# Patient Record
Sex: Female | Born: 1950 | Race: Black or African American | Hispanic: No | Smoking: Current some day smoker
Health system: Southern US, Community
[De-identification: ages and names within clinical notes are randomized; demographics above are authoritative.]

## PROBLEM LIST (undated history)

## (undated) DIAGNOSIS — E119 Type 2 diabetes mellitus without complications: Secondary | ICD-10-CM

## (undated) DIAGNOSIS — I10 Essential (primary) hypertension: Secondary | ICD-10-CM

## (undated) DIAGNOSIS — E162 Hypoglycemia, unspecified: Principal | ICD-10-CM

## (undated) HISTORY — PX: LAPAROSCOPIC SALPINGOOPHERECTOMY: SUR795

---

## 2005-02-24 ENCOUNTER — Ambulatory Visit: Payer: Self-pay | Admitting: General Practice

## 2006-02-24 ENCOUNTER — Ambulatory Visit: Payer: Self-pay | Admitting: General Practice

## 2007-03-02 ENCOUNTER — Ambulatory Visit: Payer: Self-pay | Admitting: Endocrinology

## 2007-03-09 ENCOUNTER — Ambulatory Visit: Payer: Self-pay | Admitting: Endocrinology

## 2007-11-05 ENCOUNTER — Ambulatory Visit: Payer: Self-pay | Admitting: Gastroenterology

## 2007-11-05 HISTORY — PX: COLONOSCOPY: SHX174

## 2008-04-12 ENCOUNTER — Ambulatory Visit: Payer: Self-pay | Admitting: Endocrinology

## 2008-09-26 ENCOUNTER — Ambulatory Visit: Payer: Self-pay | Admitting: Internal Medicine

## 2009-04-17 ENCOUNTER — Ambulatory Visit: Payer: Self-pay | Admitting: Internal Medicine

## 2010-06-24 ENCOUNTER — Ambulatory Visit: Payer: Self-pay | Admitting: Internal Medicine

## 2011-07-17 ENCOUNTER — Ambulatory Visit: Payer: Self-pay | Admitting: Internal Medicine

## 2013-01-26 ENCOUNTER — Ambulatory Visit: Payer: Self-pay

## 2014-01-15 ENCOUNTER — Emergency Department: Payer: Self-pay | Admitting: Emergency Medicine

## 2015-09-20 ENCOUNTER — Other Ambulatory Visit: Payer: Self-pay | Admitting: Internal Medicine

## 2015-09-20 DIAGNOSIS — Z1239 Encounter for other screening for malignant neoplasm of breast: Secondary | ICD-10-CM

## 2015-10-01 ENCOUNTER — Ambulatory Visit
Admission: RE | Admit: 2015-10-01 | Discharge: 2015-10-01 | Disposition: A | Discharge status: Home / Self Care | Payer: BLUE CROSS/BLUE SHIELD | Source: Ambulatory Visit | Attending: Internal Medicine | Admitting: Internal Medicine

## 2015-10-01 DIAGNOSIS — Z1239 Encounter for other screening for malignant neoplasm of breast: Secondary | ICD-10-CM

## 2015-10-01 DIAGNOSIS — Z1231 Encounter for screening mammogram for malignant neoplasm of breast: Secondary | ICD-10-CM | POA: Diagnosis present

## 2016-10-27 ENCOUNTER — Other Ambulatory Visit: Payer: Self-pay | Admitting: Internal Medicine

## 2016-10-27 DIAGNOSIS — Z1231 Encounter for screening mammogram for malignant neoplasm of breast: Secondary | ICD-10-CM

## 2016-10-28 ENCOUNTER — Ambulatory Visit
Admission: RE | Admit: 2016-10-28 | Discharge: 2016-10-28 | Disposition: A | Discharge status: Home / Self Care | Payer: BLUE CROSS/BLUE SHIELD | Source: Ambulatory Visit | Attending: Internal Medicine | Admitting: Internal Medicine

## 2016-10-28 DIAGNOSIS — Z1231 Encounter for screening mammogram for malignant neoplasm of breast: Secondary | ICD-10-CM | POA: Diagnosis present

## 2018-04-06 ENCOUNTER — Encounter
Admission: RE | Disposition: A | Discharge status: Home / Self Care | Payer: Self-pay | Source: Ambulatory Visit | Attending: Gastroenterology

## 2018-04-06 ENCOUNTER — Ambulatory Visit: Payer: BLUE CROSS/BLUE SHIELD | Admitting: Anesthesiology

## 2018-04-06 ENCOUNTER — Ambulatory Visit
Admission: RE | Admit: 2018-04-06 | Discharge: 2018-04-06 | Disposition: A | Discharge status: Home / Self Care | Payer: BLUE CROSS/BLUE SHIELD | Source: Ambulatory Visit | Attending: Gastroenterology | Admitting: Gastroenterology

## 2018-04-06 ENCOUNTER — Encounter: Payer: Self-pay | Admitting: Anesthesiology

## 2018-04-06 DIAGNOSIS — E119 Type 2 diabetes mellitus without complications: Secondary | ICD-10-CM | POA: Diagnosis not present

## 2018-04-06 DIAGNOSIS — K621 Rectal polyp: Secondary | ICD-10-CM | POA: Diagnosis not present

## 2018-04-06 DIAGNOSIS — I10 Essential (primary) hypertension: Secondary | ICD-10-CM | POA: Insufficient documentation

## 2018-04-06 DIAGNOSIS — Z1211 Encounter for screening for malignant neoplasm of colon: Secondary | ICD-10-CM | POA: Diagnosis not present

## 2018-04-06 DIAGNOSIS — K5730 Diverticulosis of large intestine without perforation or abscess without bleeding: Secondary | ICD-10-CM | POA: Diagnosis not present

## 2018-04-06 DIAGNOSIS — Z794 Long term (current) use of insulin: Secondary | ICD-10-CM | POA: Insufficient documentation

## 2018-04-06 DIAGNOSIS — Z79899 Other long term (current) drug therapy: Secondary | ICD-10-CM | POA: Diagnosis not present

## 2018-04-06 DIAGNOSIS — K6389 Other specified diseases of intestine: Secondary | ICD-10-CM | POA: Insufficient documentation

## 2018-04-06 DIAGNOSIS — D122 Benign neoplasm of ascending colon: Secondary | ICD-10-CM | POA: Diagnosis not present

## 2018-04-06 DIAGNOSIS — F17200 Nicotine dependence, unspecified, uncomplicated: Secondary | ICD-10-CM | POA: Insufficient documentation

## 2018-04-06 DIAGNOSIS — D123 Benign neoplasm of transverse colon: Secondary | ICD-10-CM | POA: Diagnosis not present

## 2018-04-06 HISTORY — PX: COLONOSCOPY WITH PROPOFOL: SHX5780

## 2018-04-06 HISTORY — DX: Type 2 diabetes mellitus without complications: E11.9

## 2018-04-06 HISTORY — DX: Essential (primary) hypertension: I10

## 2018-04-06 LAB — GLUCOSE, CAPILLARY: GLUCOSE-CAPILLARY: 116 mg/dL — AB (ref 70–99)

## 2018-04-06 SURGERY — COLONOSCOPY WITH PROPOFOL
Anesthesia: General

## 2018-04-06 MED ORDER — EPHEDRINE SULFATE 50 MG/ML IJ SOLN
INTRAMUSCULAR | Status: DC | PRN
  Administered 2018-04-06 (×2): 10 mg via INTRAVENOUS

## 2018-04-06 MED ORDER — PROPOFOL 500 MG/50ML IV EMUL
INTRAVENOUS | Status: AC
  Filled 2018-04-06: qty 50

## 2018-04-06 MED ORDER — FENTANYL CITRATE (PF) 100 MCG/2ML IJ SOLN
INTRAMUSCULAR | Status: DC | PRN
  Administered 2018-04-06: 25 ug via INTRAVENOUS

## 2018-04-06 MED ORDER — LIDOCAINE HCL (PF) 1 % IJ SOLN
2.0000 mL | Freq: Once | INTRAMUSCULAR | Status: AC
  Administered 2018-04-06: 0.3 mL via INTRADERMAL

## 2018-04-06 MED ORDER — MIDAZOLAM HCL 2 MG/2ML IJ SOLN
INTRAMUSCULAR | Status: DC | PRN
  Administered 2018-04-06: 0.5 mg via INTRAVENOUS

## 2018-04-06 MED ORDER — SODIUM CHLORIDE 0.9 % IV SOLN
INTRAVENOUS | Status: DC
  Administered 2018-04-06: 1000 mL via INTRAVENOUS

## 2018-04-06 MED ORDER — PROPOFOL 10 MG/ML IV BOLUS
INTRAVENOUS | Status: DC | PRN
  Administered 2018-04-06: 30 mg via INTRAVENOUS

## 2018-04-06 MED ORDER — PROPOFOL 10 MG/ML IV BOLUS
INTRAVENOUS | Status: AC
  Filled 2018-04-06: qty 20

## 2018-04-06 MED ORDER — LIDOCAINE HCL (PF) 1 % IJ SOLN
INTRAMUSCULAR | Status: AC
  Administered 2018-04-06: 0.3 mL via INTRADERMAL
  Filled 2018-04-06: qty 2

## 2018-04-06 MED ORDER — MIDAZOLAM HCL 2 MG/2ML IJ SOLN
INTRAMUSCULAR | Status: AC
  Filled 2018-04-06: qty 2

## 2018-04-06 MED ORDER — PROPOFOL 500 MG/50ML IV EMUL
INTRAVENOUS | Status: DC | PRN
  Administered 2018-04-06: 180 ug/kg/min via INTRAVENOUS

## 2018-04-06 MED ORDER — FENTANYL CITRATE (PF) 100 MCG/2ML IJ SOLN
INTRAMUSCULAR | Status: AC
  Filled 2018-04-06: qty 2

## 2018-04-06 NOTE — H&P (Signed)
Outpatient short stay form Pre-procedure 04/06/2018 8:03 AM Kristina Sails MD  Primary Physician: Glendon Axe, MD  Reason for visit: Colonoscopy  History of present illness: Patient is a 67 year old female presenting today as above.  This is for screening purposes.  Takes no aspirin or blood thinning agent.  Her last colonoscopy was in 2009.  There were no adenomatous polyps that time.  She has no family history of colorectal cancer.  Tolerated her prep well.    Current Facility-Administered Medications:  .  lidocaine (PF) (XYLOCAINE) 1 % injection, , , ,  .  0.9 %  sodium chloride infusion, , Intravenous, Continuous, Danilo Cappiello U, MD .  lidocaine (PF) (XYLOCAINE) 1 % injection 2 mL, 2 mL, Intradermal, Once, Kristina Sails, MD  Medications Prior to Admission  Medication Sig Dispense Refill Last Dose  . Calcium Carb-Cholecalciferol 600-400 MG-UNIT CAPS Take 1 tablet by mouth daily.     Marland Kitchen glimepiride (AMARYL) 4 MG tablet Take 4 mg by mouth 2 (two) times daily.     . insulin detemir (LEVEMIR) 100 UNIT/ML injection Inject 20 Units into the skin at bedtime.     Marland Kitchen lisinopril (PRINIVIL,ZESTRIL) 5 MG tablet Take 5 mg by mouth daily.        No Known Allergies   Past Medical History:  Diagnosis Date  . Diabetes mellitus without complication ([place])   . Hypertension     Review of systems:      Physical Exam    Heart and lungs: Gallop, lungs are bilaterally clear.    HEENT: Normocephalic atraumatic eyes are anicteric    Other:    Pertinant exam for procedure: Soft nontender nondistended bowel sounds positive normoactive    Planned proceedures: Colonoscopy and indicated procedures. I have discussed the risks benefits and complications of procedures to include not limited to bleeding, infection, perforation and the risk of sedation and the patient wishes to proceed.    Kristina Sails, MD Gastroenterology 04/06/2018  8:03 AM

## 2018-04-06 NOTE — Anesthesia Preprocedure Evaluation (Addendum)
Anesthesia Evaluation  Patient identified by MRN, date of birth, ID band Patient awake    Reviewed: Allergy & Precautions, NPO status , Patient's Chart, lab work & pertinent test results, reviewed documented beta blocker date and time   Airway Mallampati: II  TM Distance: >3 FB     Dental  (+) Upper Dentures, Lower Dentures   Pulmonary Current Smoker,           Cardiovascular hypertension, Pt. on medications      Neuro/Psych    GI/Hepatic   Endo/Other  diabetes, Type 2  Renal/GU      Musculoskeletal   Abdominal   Peds  Hematology   Anesthesia Other Findings   Reproductive/Obstetrics                            Anesthesia Physical Anesthesia Plan  ASA: II  Anesthesia Plan: General   Post-op Pain Management:    Induction: Intravenous  PONV Risk Score and Plan:   Airway Management Planned:   Additional Equipment:   Intra-op Plan:   Post-operative Plan:   Informed Consent: I have reviewed the patients History and Physical, chart, labs and discussed the procedure including the risks, benefits and alternatives for the proposed anesthesia with the patient or authorized representative who has indicated his/her understanding and acceptance.     Plan Discussed with: CRNA  Anesthesia Plan Comments:         Anesthesia Quick Evaluation

## 2018-04-06 NOTE — Anesthesia Procedure Notes (Signed)
Date/Time: 04/06/2018 8:00 AM Performed by: Allean Found, CRNA Pre-anesthesia Checklist: Patient identified, Emergency Drugs available, Suction available, Patient being monitored and Timeout performed Oxygen Delivery Method: Nasal cannula Placement Confirmation: positive ETCO2

## 2018-04-06 NOTE — Transfer of Care (Signed)
Immediate Anesthesia Transfer of Care Note  Patient: Catrena Vari Riggle  Procedure(s) Performed: COLONOSCOPY WITH PROPOFOL (N/A )  Patient Location: PACU  Anesthesia Type:General  Level of Consciousness: awake  Airway & Oxygen Therapy: Patient Spontanous Breathing and Patient connected to nasal cannula oxygen  Post-op Assessment: Report given to RN and Post -op Vital signs reviewed and stable  Post vital signs: Reviewed and stable  Last Vitals:  Vitals Value Taken Time  BP 111/59 04/06/2018  9:28 AM  Temp 36.1 C 04/06/2018  9:28 AM  Pulse 78 04/06/2018  9:30 AM  Resp 16 04/06/2018  9:30 AM  SpO2 100 % 04/06/2018  9:30 AM  Vitals shown include unvalidated device data.  Last Pain:  Vitals:   04/06/18 0928  TempSrc: Tympanic  PainSc:          Complications: No apparent anesthesia complications

## 2018-04-06 NOTE — Anesthesia Postprocedure Evaluation (Signed)
Anesthesia Post Note  Patient: Kristina Doyle  Procedure(s) Performed: COLONOSCOPY WITH PROPOFOL (N/A )  Patient location during evaluation: Endoscopy Anesthesia Type: General Level of consciousness: awake and alert Pain management: pain level controlled Vital Signs Assessment: post-procedure vital signs reviewed and stable Respiratory status: spontaneous breathing, nonlabored ventilation, respiratory function stable and patient connected to nasal cannula oxygen Cardiovascular status: blood pressure returned to baseline and stable Postop Assessment: no apparent nausea or vomiting Anesthetic complications: no     Last Vitals:  Vitals:   04/06/18 0750 04/06/18 0928  BP: (!) 160/70   Pulse: 64   Resp: 17   Temp: (!) 35.8 C (!) 36.1 C  SpO2: 100%     Last Pain:  Vitals:   04/06/18 0928  TempSrc: Tympanic  PainSc:                  Kristina Doyle S

## 2018-04-06 NOTE — Op Note (Signed)
[facility] Gastroenterology Patient Name: Kristina Doyle Procedure Date: 04/06/2018 8:02 AM MRN: [id number] Account #: [id number] Date of Birth: 10/31/1950 Admit Type: Outpatient Age: 67 Room: [facility] ENDO ROOM 3 Gender: Female Note Status: Finalized Procedure:            Colonoscopy Providers:            Lollie Sails, MD Referring MD:         Glendon Axe (Referring MD) Medicines:            Monitored Anesthesia Care Complications:        No immediate complications. Procedure:            Pre-Anesthesia Assessment:                       - ASA Grade Assessment: III - A patient with severe                        systemic disease.                       After obtaining informed consent, the colonoscope was                        passed under direct vision. Throughout the procedure,                        the patient's blood pressure, pulse, and oxygen                        saturations were monitored continuously. The                        Colonoscope was introduced through the anus and                        advanced to the the cecum, identified by appendiceal                        orifice and ileocecal valve. The colonoscopy was                        unusually difficult due to a redundant colon.                        Successful completion of the procedure was aided by                        changing the patient to a supine position, changing the                        patient to a prone position, using manual pressure and                        lavage. The patient tolerated the procedure well. The                        quality of the bowel preparation was fair to poor. Findings:      Multiple small-mouthed diverticula were found in the sigmoid colon and       descending colon.  A 3 mm polyp was found in the proximal descending colon. The polyp was       sessile. The polyp was removed with a cold biopsy forceps. Resection and       retrieval were  complete.      A 3 mm polyp was found in the splenic flexure. The polyp was sessile.       The polyp was removed with a cold biopsy forceps. Resection and       retrieval were complete.      A 6 mm polyp was found in the transverse colon. The polyp was sessile.       The polyp was removed with a piecemeal technique using a cold biopsy       forceps. Resection and retrieval were complete.      Three sessile polyps were found in the distal ascending colon. The       polyps were 1 to 3 mm in size. These polyps were removed with a cold       biopsy forceps. Resection and retrieval were complete.      Six sessile polyps were found in the recto-sigmoid colon. The polyps       were 1 to 3 mm in size. These polyps were removed with a cold biopsy       forceps. Resection and retrieval were complete.      Three sessile polyps were found in the rectum. The polyps were 1 to 2 mm       in size. These polyps were removed with a cold biopsy forceps. Resection       and retrieval were complete.      The digital rectal exam was normal. Impression:           - Preparation of the colon was fair.                       - Diverticulosis in the sigmoid colon and in the                        descending colon.                       - One 3 mm polyp in the proximal descending colon,                        removed with a cold biopsy forceps. Resected and                        retrieved.                       - One 3 mm polyp at the splenic flexure, removed with a                        cold biopsy forceps. Resected and retrieved.                       - One 6 mm polyp in the transverse colon, removed                        piecemeal using a cold biopsy forceps. Resected and  retrieved.                       - Three 1 to 3 mm polyps in the distal ascending colon,                        removed with a cold biopsy forceps. Resected and                        retrieved.                        - Six 1 to 3 mm polyps at the recto-sigmoid colon,                        removed with a cold biopsy forceps. Resected and                        retrieved.                       - Three 1 to 2 mm polyps in the rectum, removed with a                        cold biopsy forceps. Resected and retrieved. Recommendation:       - Discharge patient to home [Means].                       - Await pathology results. Procedure Code(s):    --- Professional ---                       [phone], Colonoscopy, flexible; with biopsy, single or                        multiple Diagnosis Code(s):    --- Professional ---                       D12.4, Benign neoplasm of descending colon                       D12.3, Benign neoplasm of transverse colon (hepatic                        flexure or splenic flexure)                       D12.2, Benign neoplasm of ascending colon                       D12.7, Benign neoplasm of rectosigmoid junction                       K62.1, Rectal polyp                       K57.30, Diverticulosis of large intestine without                        perforation or abscess without bleeding CPT copyright 2017 American Medical Association. All rights reserved. The codes documented in this report are preliminary and upon coder review may  be revised to meet current  compliance requirements. Lollie Sails, MD 04/06/2018 9:22:46 AM This report has been signed electronically. Number of Addenda: 0 Note Initiated On: 04/06/2018 8:02 AM Scope Withdrawal Time: 0 hours 17 minutes 54 seconds  Total Procedure Duration: 0 hours 58 minutes 39 seconds       [facility]

## 2018-04-06 NOTE — Anesthesia Post-op Follow-up Note (Signed)
Anesthesia QCDR form completed.        

## 2018-04-07 LAB — SURGICAL PATHOLOGY

## 2018-04-08 ENCOUNTER — Encounter: Payer: Self-pay | Admitting: Gastroenterology

## 2019-03-09 ENCOUNTER — Other Ambulatory Visit: Payer: Self-pay | Admitting: Internal Medicine

## 2019-03-09 DIAGNOSIS — Z1231 Encounter for screening mammogram for malignant neoplasm of breast: Secondary | ICD-10-CM

## 2021-03-28 ENCOUNTER — Other Ambulatory Visit: Payer: Self-pay | Admitting: Internal Medicine

## 2021-03-28 DIAGNOSIS — Z1231 Encounter for screening mammogram for malignant neoplasm of breast: Secondary | ICD-10-CM

## 2021-05-06 DIAGNOSIS — E113293 Type 2 diabetes mellitus with mild nonproliferative diabetic retinopathy without macular edema, bilateral: Secondary | ICD-10-CM | POA: Diagnosis not present

## 2021-05-06 DIAGNOSIS — Z794 Long term (current) use of insulin: Secondary | ICD-10-CM | POA: Diagnosis not present

## 2021-05-13 DIAGNOSIS — Z794 Long term (current) use of insulin: Secondary | ICD-10-CM | POA: Diagnosis not present

## 2021-05-13 DIAGNOSIS — E113293 Type 2 diabetes mellitus with mild nonproliferative diabetic retinopathy without macular edema, bilateral: Secondary | ICD-10-CM | POA: Diagnosis not present

## 2021-05-13 DIAGNOSIS — F17210 Nicotine dependence, cigarettes, uncomplicated: Secondary | ICD-10-CM | POA: Diagnosis not present

## 2021-05-13 DIAGNOSIS — Z780 Asymptomatic menopausal state: Secondary | ICD-10-CM | POA: Diagnosis not present

## 2021-05-14 DIAGNOSIS — M8588 Other specified disorders of bone density and structure, other site: Secondary | ICD-10-CM | POA: Diagnosis not present

## 2021-07-29 DIAGNOSIS — E113293 Type 2 diabetes mellitus with mild nonproliferative diabetic retinopathy without macular edema, bilateral: Secondary | ICD-10-CM | POA: Diagnosis not present

## 2021-07-29 DIAGNOSIS — H34812 Central retinal vein occlusion, left eye, with macular edema: Secondary | ICD-10-CM | POA: Diagnosis not present

## 2021-07-29 DIAGNOSIS — H3582 Retinal ischemia: Secondary | ICD-10-CM | POA: Diagnosis not present

## 2021-07-29 DIAGNOSIS — H43822 Vitreomacular adhesion, left eye: Secondary | ICD-10-CM | POA: Diagnosis not present

## 2021-08-16 DIAGNOSIS — E113293 Type 2 diabetes mellitus with mild nonproliferative diabetic retinopathy without macular edema, bilateral: Secondary | ICD-10-CM | POA: Diagnosis not present

## 2021-08-16 DIAGNOSIS — I10 Essential (primary) hypertension: Secondary | ICD-10-CM | POA: Diagnosis not present

## 2021-08-16 DIAGNOSIS — F17200 Nicotine dependence, unspecified, uncomplicated: Secondary | ICD-10-CM | POA: Diagnosis not present

## 2021-08-16 DIAGNOSIS — Z794 Long term (current) use of insulin: Secondary | ICD-10-CM | POA: Diagnosis not present

## 2021-11-20 DIAGNOSIS — Z1231 Encounter for screening mammogram for malignant neoplasm of breast: Secondary | ICD-10-CM | POA: Diagnosis not present

## 2021-11-20 DIAGNOSIS — Z0000 Encounter for general adult medical examination without abnormal findings: Secondary | ICD-10-CM | POA: Diagnosis not present

## 2021-11-20 DIAGNOSIS — J3089 Other allergic rhinitis: Secondary | ICD-10-CM | POA: Diagnosis not present

## 2021-11-20 DIAGNOSIS — E1165 Type 2 diabetes mellitus with hyperglycemia: Secondary | ICD-10-CM | POA: Diagnosis not present

## 2021-11-20 DIAGNOSIS — I10 Essential (primary) hypertension: Secondary | ICD-10-CM | POA: Diagnosis not present

## 2021-11-20 DIAGNOSIS — Z794 Long term (current) use of insulin: Secondary | ICD-10-CM | POA: Diagnosis not present

## 2021-11-25 DIAGNOSIS — H43822 Vitreomacular adhesion, left eye: Secondary | ICD-10-CM | POA: Diagnosis not present

## 2021-11-25 DIAGNOSIS — H348122 Central retinal vein occlusion, left eye, stable: Secondary | ICD-10-CM | POA: Diagnosis not present

## 2021-11-25 DIAGNOSIS — H3582 Retinal ischemia: Secondary | ICD-10-CM | POA: Diagnosis not present

## 2021-11-25 DIAGNOSIS — E113293 Type 2 diabetes mellitus with mild nonproliferative diabetic retinopathy without macular edema, bilateral: Secondary | ICD-10-CM | POA: Diagnosis not present

## 2021-11-27 ENCOUNTER — Other Ambulatory Visit: Payer: Self-pay | Admitting: Internal Medicine

## 2021-11-27 DIAGNOSIS — Z1231 Encounter for screening mammogram for malignant neoplasm of breast: Secondary | ICD-10-CM

## 2021-11-27 DIAGNOSIS — Z794 Long term (current) use of insulin: Secondary | ICD-10-CM | POA: Diagnosis not present

## 2021-11-27 DIAGNOSIS — M19041 Primary osteoarthritis, right hand: Secondary | ICD-10-CM | POA: Diagnosis not present

## 2021-11-27 DIAGNOSIS — I10 Essential (primary) hypertension: Secondary | ICD-10-CM | POA: Diagnosis not present

## 2021-11-27 DIAGNOSIS — E119 Type 2 diabetes mellitus without complications: Secondary | ICD-10-CM | POA: Diagnosis not present

## 2021-11-27 DIAGNOSIS — F17210 Nicotine dependence, cigarettes, uncomplicated: Secondary | ICD-10-CM | POA: Diagnosis not present

## 2021-11-27 DIAGNOSIS — M79641 Pain in right hand: Secondary | ICD-10-CM | POA: Diagnosis not present

## 2021-12-03 DIAGNOSIS — E113293 Type 2 diabetes mellitus with mild nonproliferative diabetic retinopathy without macular edema, bilateral: Secondary | ICD-10-CM | POA: Diagnosis not present

## 2021-12-03 DIAGNOSIS — Z794 Long term (current) use of insulin: Secondary | ICD-10-CM | POA: Diagnosis not present

## 2021-12-04 DIAGNOSIS — F17210 Nicotine dependence, cigarettes, uncomplicated: Secondary | ICD-10-CM | POA: Diagnosis not present

## 2021-12-04 DIAGNOSIS — E113293 Type 2 diabetes mellitus with mild nonproliferative diabetic retinopathy without macular edema, bilateral: Secondary | ICD-10-CM | POA: Diagnosis not present

## 2021-12-04 DIAGNOSIS — I10 Essential (primary) hypertension: Secondary | ICD-10-CM | POA: Diagnosis not present

## 2021-12-04 DIAGNOSIS — Z794 Long term (current) use of insulin: Secondary | ICD-10-CM | POA: Diagnosis not present

## 2022-01-08 ENCOUNTER — Ambulatory Visit
Admission: RE | Admit: 2022-01-08 | Discharge: 2022-01-08 | Disposition: A | Discharge status: Home / Self Care | Payer: Medicare HMO | Source: Ambulatory Visit | Attending: Internal Medicine | Admitting: Internal Medicine

## 2022-01-08 DIAGNOSIS — Z1231 Encounter for screening mammogram for malignant neoplasm of breast: Secondary | ICD-10-CM | POA: Insufficient documentation

## 2022-01-14 ENCOUNTER — Other Ambulatory Visit: Payer: Self-pay

## 2022-01-14 ENCOUNTER — Emergency Department
Admission: EM | Admit: 2022-01-14 | Discharge: 2022-01-14 | Disposition: A | Discharge status: Home / Self Care | Payer: Medicare HMO | Attending: Emergency Medicine | Admitting: Emergency Medicine

## 2022-01-14 ENCOUNTER — Emergency Department: Payer: Medicare HMO

## 2022-01-14 DIAGNOSIS — S29012A Strain of muscle and tendon of back wall of thorax, initial encounter: Secondary | ICD-10-CM | POA: Diagnosis not present

## 2022-01-14 DIAGNOSIS — M546 Pain in thoracic spine: Secondary | ICD-10-CM | POA: Diagnosis not present

## 2022-01-14 DIAGNOSIS — Y92410 Unspecified street and highway as the place of occurrence of the external cause: Secondary | ICD-10-CM | POA: Insufficient documentation

## 2022-01-14 DIAGNOSIS — E119 Type 2 diabetes mellitus without complications: Secondary | ICD-10-CM | POA: Diagnosis not present

## 2022-01-14 DIAGNOSIS — S161XXA Strain of muscle, fascia and tendon at neck level, initial encounter: Secondary | ICD-10-CM | POA: Insufficient documentation

## 2022-01-14 DIAGNOSIS — M40204 Unspecified kyphosis, thoracic region: Secondary | ICD-10-CM | POA: Diagnosis not present

## 2022-01-14 DIAGNOSIS — I10 Essential (primary) hypertension: Secondary | ICD-10-CM | POA: Diagnosis not present

## 2022-01-14 DIAGNOSIS — M542 Cervicalgia: Secondary | ICD-10-CM | POA: Diagnosis not present

## 2022-01-14 DIAGNOSIS — Z1231 Encounter for screening mammogram for malignant neoplasm of breast: Secondary | ICD-10-CM

## 2022-01-14 DIAGNOSIS — S199XXA Unspecified injury of neck, initial encounter: Secondary | ICD-10-CM | POA: Diagnosis present

## 2022-01-14 MED ORDER — CYCLOBENZAPRINE HCL 10 MG PO TABS: 10.0000 mg | ORAL_TABLET | Freq: Three times a day (TID) | ORAL | 0 refills | Status: AC | PRN
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
## 2022-01-14 NOTE — Discharge Instructions (Addendum)
-  You may treat pain with Tylenol/ibuprofen as needed.  You may additionally utilize cyclobenzaprine as well, though take caution when taking this medication as it may make you sleepy/drowsy. ?-Follow-up with your primary care provider as needed in regards to the incidental findings on your CT scan. ?-Return to the emergency department anytime if you begin to experience any new or worsening symptoms ?
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
## 2022-01-14 NOTE — ED Notes (Signed)
Patient transported to CT 
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
## 2022-05-23 DIAGNOSIS — I10 Essential (primary) hypertension: Secondary | ICD-10-CM | POA: Diagnosis not present

## 2022-05-23 DIAGNOSIS — M79641 Pain in right hand: Secondary | ICD-10-CM | POA: Diagnosis not present

## 2022-05-23 DIAGNOSIS — E7800 Pure hypercholesterolemia, unspecified: Secondary | ICD-10-CM | POA: Diagnosis not present

## 2022-05-23 DIAGNOSIS — Z794 Long term (current) use of insulin: Secondary | ICD-10-CM | POA: Diagnosis not present

## 2022-05-23 DIAGNOSIS — Z720 Tobacco use: Secondary | ICD-10-CM | POA: Diagnosis not present

## 2022-05-23 DIAGNOSIS — E1165 Type 2 diabetes mellitus with hyperglycemia: Secondary | ICD-10-CM | POA: Diagnosis not present

## 2022-05-26 DIAGNOSIS — H35033 Hypertensive retinopathy, bilateral: Secondary | ICD-10-CM | POA: Diagnosis not present

## 2022-05-26 DIAGNOSIS — H43813 Vitreous degeneration, bilateral: Secondary | ICD-10-CM | POA: Diagnosis not present

## 2022-05-26 DIAGNOSIS — H3582 Retinal ischemia: Secondary | ICD-10-CM | POA: Diagnosis not present

## 2022-05-26 DIAGNOSIS — E113293 Type 2 diabetes mellitus with mild nonproliferative diabetic retinopathy without macular edema, bilateral: Secondary | ICD-10-CM | POA: Diagnosis not present

## 2022-05-26 DIAGNOSIS — H348122 Central retinal vein occlusion, left eye, stable: Secondary | ICD-10-CM | POA: Diagnosis not present

## 2022-05-26 DIAGNOSIS — H43822 Vitreomacular adhesion, left eye: Secondary | ICD-10-CM | POA: Diagnosis not present

## 2022-05-30 DIAGNOSIS — Z0000 Encounter for general adult medical examination without abnormal findings: Secondary | ICD-10-CM | POA: Diagnosis not present

## 2022-05-30 DIAGNOSIS — I10 Essential (primary) hypertension: Secondary | ICD-10-CM | POA: Diagnosis not present

## 2022-05-30 DIAGNOSIS — Z794 Long term (current) use of insulin: Secondary | ICD-10-CM | POA: Diagnosis not present

## 2022-05-30 DIAGNOSIS — F17210 Nicotine dependence, cigarettes, uncomplicated: Secondary | ICD-10-CM | POA: Diagnosis not present

## 2022-05-30 DIAGNOSIS — E119 Type 2 diabetes mellitus without complications: Secondary | ICD-10-CM | POA: Diagnosis not present

## 2022-05-30 DIAGNOSIS — Z1331 Encounter for screening for depression: Secondary | ICD-10-CM | POA: Diagnosis not present

## 2022-05-30 DIAGNOSIS — Z122 Encounter for screening for malignant neoplasm of respiratory organs: Secondary | ICD-10-CM | POA: Diagnosis not present

## 2022-06-09 DIAGNOSIS — Z794 Long term (current) use of insulin: Secondary | ICD-10-CM | POA: Diagnosis not present

## 2022-06-09 DIAGNOSIS — F17200 Nicotine dependence, unspecified, uncomplicated: Secondary | ICD-10-CM | POA: Diagnosis not present

## 2022-06-09 DIAGNOSIS — I10 Essential (primary) hypertension: Secondary | ICD-10-CM | POA: Diagnosis not present

## 2022-06-09 DIAGNOSIS — E113293 Type 2 diabetes mellitus with mild nonproliferative diabetic retinopathy without macular edema, bilateral: Secondary | ICD-10-CM | POA: Diagnosis not present

## 2022-09-23 DIAGNOSIS — E113393 Type 2 diabetes mellitus with moderate nonproliferative diabetic retinopathy without macular edema, bilateral: Secondary | ICD-10-CM | POA: Diagnosis not present

## 2022-09-23 DIAGNOSIS — Z0100 Encounter for examination of eyes and vision without abnormal findings: Secondary | ICD-10-CM | POA: Diagnosis not present

## 2022-09-23 DIAGNOSIS — H524 Presbyopia: Secondary | ICD-10-CM | POA: Diagnosis not present

## 2022-11-21 DIAGNOSIS — I10 Essential (primary) hypertension: Secondary | ICD-10-CM | POA: Diagnosis not present

## 2022-11-21 DIAGNOSIS — E1165 Type 2 diabetes mellitus with hyperglycemia: Secondary | ICD-10-CM | POA: Diagnosis not present

## 2022-11-21 DIAGNOSIS — J3089 Other allergic rhinitis: Secondary | ICD-10-CM | POA: Diagnosis not present

## 2022-11-21 DIAGNOSIS — E7800 Pure hypercholesterolemia, unspecified: Secondary | ICD-10-CM | POA: Diagnosis not present

## 2022-11-21 DIAGNOSIS — D649 Anemia, unspecified: Secondary | ICD-10-CM | POA: Diagnosis not present

## 2022-11-21 DIAGNOSIS — E113293 Type 2 diabetes mellitus with mild nonproliferative diabetic retinopathy without macular edema, bilateral: Secondary | ICD-10-CM | POA: Diagnosis not present

## 2022-11-21 DIAGNOSIS — Z794 Long term (current) use of insulin: Secondary | ICD-10-CM | POA: Diagnosis not present

## 2022-11-24 DIAGNOSIS — H35372 Puckering of macula, left eye: Secondary | ICD-10-CM | POA: Diagnosis not present

## 2022-11-24 DIAGNOSIS — H43822 Vitreomacular adhesion, left eye: Secondary | ICD-10-CM | POA: Diagnosis not present

## 2022-11-24 DIAGNOSIS — H34812 Central retinal vein occlusion, left eye, with macular edema: Secondary | ICD-10-CM | POA: Diagnosis not present

## 2022-11-28 DIAGNOSIS — F17210 Nicotine dependence, cigarettes, uncomplicated: Secondary | ICD-10-CM | POA: Diagnosis not present

## 2022-11-28 DIAGNOSIS — Z794 Long term (current) use of insulin: Secondary | ICD-10-CM | POA: Diagnosis not present

## 2022-11-28 DIAGNOSIS — E538 Deficiency of other specified B group vitamins: Secondary | ICD-10-CM | POA: Diagnosis not present

## 2022-11-28 DIAGNOSIS — E119 Type 2 diabetes mellitus without complications: Secondary | ICD-10-CM | POA: Diagnosis not present

## 2022-11-28 DIAGNOSIS — Z0000 Encounter for general adult medical examination without abnormal findings: Secondary | ICD-10-CM | POA: Diagnosis not present

## 2022-11-28 DIAGNOSIS — I10 Essential (primary) hypertension: Secondary | ICD-10-CM | POA: Diagnosis not present

## 2022-12-08 DIAGNOSIS — D649 Anemia, unspecified: Secondary | ICD-10-CM | POA: Diagnosis not present

## 2022-12-08 DIAGNOSIS — Z79899 Other long term (current) drug therapy: Secondary | ICD-10-CM | POA: Diagnosis not present

## 2022-12-09 DIAGNOSIS — E113293 Type 2 diabetes mellitus with mild nonproliferative diabetic retinopathy without macular edema, bilateral: Secondary | ICD-10-CM | POA: Diagnosis not present

## 2022-12-09 DIAGNOSIS — F17200 Nicotine dependence, unspecified, uncomplicated: Secondary | ICD-10-CM | POA: Diagnosis not present

## 2022-12-09 DIAGNOSIS — Z794 Long term (current) use of insulin: Secondary | ICD-10-CM | POA: Diagnosis not present

## 2022-12-09 DIAGNOSIS — I10 Essential (primary) hypertension: Secondary | ICD-10-CM | POA: Diagnosis not present

## 2023-03-02 ENCOUNTER — Other Ambulatory Visit: Payer: Self-pay | Admitting: Internal Medicine

## 2023-03-02 DIAGNOSIS — Z1231 Encounter for screening mammogram for malignant neoplasm of breast: Secondary | ICD-10-CM

## 2023-03-11 ENCOUNTER — Ambulatory Visit
Admission: RE | Admit: 2023-03-11 | Discharge: 2023-03-11 | Disposition: A | Discharge status: Home / Self Care | Payer: Medicare HMO | Source: Ambulatory Visit | Attending: Internal Medicine | Admitting: Internal Medicine

## 2023-03-11 DIAGNOSIS — Z1231 Encounter for screening mammogram for malignant neoplasm of breast: Secondary | ICD-10-CM | POA: Insufficient documentation

## 2023-04-13 DIAGNOSIS — I10 Essential (primary) hypertension: Secondary | ICD-10-CM | POA: Diagnosis not present

## 2023-04-13 DIAGNOSIS — Z794 Long term (current) use of insulin: Secondary | ICD-10-CM | POA: Diagnosis not present

## 2023-04-13 DIAGNOSIS — E113293 Type 2 diabetes mellitus with mild nonproliferative diabetic retinopathy without macular edema, bilateral: Secondary | ICD-10-CM | POA: Diagnosis not present

## 2023-04-13 DIAGNOSIS — F17210 Nicotine dependence, cigarettes, uncomplicated: Secondary | ICD-10-CM | POA: Diagnosis not present

## 2023-04-30 DIAGNOSIS — E113293 Type 2 diabetes mellitus with mild nonproliferative diabetic retinopathy without macular edema, bilateral: Secondary | ICD-10-CM | POA: Diagnosis not present

## 2023-04-30 DIAGNOSIS — H35033 Hypertensive retinopathy, bilateral: Secondary | ICD-10-CM | POA: Diagnosis not present

## 2023-04-30 DIAGNOSIS — H3581 Retinal edema: Secondary | ICD-10-CM | POA: Diagnosis not present

## 2023-04-30 DIAGNOSIS — H34812 Central retinal vein occlusion, left eye, with macular edema: Secondary | ICD-10-CM | POA: Diagnosis not present

## 2023-04-30 DIAGNOSIS — H35372 Puckering of macula, left eye: Secondary | ICD-10-CM | POA: Diagnosis not present

## 2023-04-30 DIAGNOSIS — H43822 Vitreomacular adhesion, left eye: Secondary | ICD-10-CM | POA: Diagnosis not present

## 2023-04-30 DIAGNOSIS — H43813 Vitreous degeneration, bilateral: Secondary | ICD-10-CM | POA: Diagnosis not present

## 2023-06-03 DIAGNOSIS — Z794 Long term (current) use of insulin: Secondary | ICD-10-CM | POA: Diagnosis not present

## 2023-06-03 DIAGNOSIS — I10 Essential (primary) hypertension: Secondary | ICD-10-CM | POA: Diagnosis not present

## 2023-06-03 DIAGNOSIS — E1165 Type 2 diabetes mellitus with hyperglycemia: Secondary | ICD-10-CM | POA: Diagnosis not present

## 2023-06-03 DIAGNOSIS — E538 Deficiency of other specified B group vitamins: Secondary | ICD-10-CM | POA: Diagnosis not present

## 2023-06-03 DIAGNOSIS — D649 Anemia, unspecified: Secondary | ICD-10-CM | POA: Diagnosis not present

## 2023-06-03 DIAGNOSIS — E7800 Pure hypercholesterolemia, unspecified: Secondary | ICD-10-CM | POA: Diagnosis not present

## 2023-06-03 DIAGNOSIS — J3089 Other allergic rhinitis: Secondary | ICD-10-CM | POA: Diagnosis not present

## 2023-06-10 DIAGNOSIS — I10 Essential (primary) hypertension: Secondary | ICD-10-CM | POA: Diagnosis not present

## 2023-06-10 DIAGNOSIS — F17210 Nicotine dependence, cigarettes, uncomplicated: Secondary | ICD-10-CM | POA: Diagnosis not present

## 2023-06-10 DIAGNOSIS — E538 Deficiency of other specified B group vitamins: Secondary | ICD-10-CM | POA: Diagnosis not present

## 2023-06-10 DIAGNOSIS — Z0000 Encounter for general adult medical examination without abnormal findings: Secondary | ICD-10-CM | POA: Diagnosis not present

## 2023-06-10 DIAGNOSIS — E119 Type 2 diabetes mellitus without complications: Secondary | ICD-10-CM | POA: Diagnosis not present

## 2023-06-10 DIAGNOSIS — Z1331 Encounter for screening for depression: Secondary | ICD-10-CM | POA: Diagnosis not present
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
## 2024-02-07 ENCOUNTER — Other Ambulatory Visit: Payer: Self-pay

## 2024-02-07 ENCOUNTER — Observation Stay
Admission: EM | Admit: 2024-02-07 | Discharge: 2024-02-08 | Disposition: A | Discharge status: Home / Self Care | Attending: Internal Medicine | Admitting: Internal Medicine

## 2024-02-07 DIAGNOSIS — F17210 Nicotine dependence, cigarettes, uncomplicated: Secondary | ICD-10-CM | POA: Insufficient documentation

## 2024-02-07 DIAGNOSIS — I10 Essential (primary) hypertension: Secondary | ICD-10-CM | POA: Diagnosis not present

## 2024-02-07 DIAGNOSIS — E160 Drug-induced hypoglycemia without coma: Secondary | ICD-10-CM

## 2024-02-07 DIAGNOSIS — E162 Hypoglycemia, unspecified: Secondary | ICD-10-CM | POA: Diagnosis not present

## 2024-02-07 DIAGNOSIS — E11649 Type 2 diabetes mellitus with hypoglycemia without coma: Secondary | ICD-10-CM | POA: Diagnosis present

## 2024-02-07 DIAGNOSIS — T383X1A Poisoning by insulin and oral hypoglycemic [antidiabetic] drugs, accidental (unintentional), initial encounter: Secondary | ICD-10-CM

## 2024-02-07 HISTORY — DX: Hypoglycemia, unspecified: E16.2

## 2024-02-07 LAB — CBG MONITORING, ED
Glucose-Capillary: 67 mg/dL — ABNORMAL LOW (ref 70–99)
Glucose-Capillary: 75 mg/dL (ref 70–99)
Glucose-Capillary: 52 mg/dL — ABNORMAL LOW (ref 70–99)
Glucose-Capillary: 69 mg/dL — ABNORMAL LOW (ref 70–99)

## 2024-02-07 LAB — BASIC METABOLIC PANEL WITH GFR
Anion gap: 11 (ref 5–15)
BUN: 11 mg/dL (ref 8–23)
CO2: 21 mmol/L — ABNORMAL LOW (ref 22–32)
Calcium: 9 mg/dL (ref 8.9–10.3)
Chloride: 106 mmol/L (ref 98–111)
Creatinine, Ser: 0.6 mg/dL (ref 0.44–1.00)
GFR, Estimated: >60 mL/min (ref >60)
Glucose, Bld: 60 mg/dL — ABNORMAL LOW (ref 70–99)
Potassium: 3.8 mmol/L (ref 3.5–5.1)
Sodium: 138 mmol/L (ref 135–145)

## 2024-02-07 LAB — URINALYSIS, ROUTINE W REFLEX MICROSCOPIC
Bacteria, UA: NONE SEEN
Bilirubin Urine: NEGATIVE
Glucose, UA: NEGATIVE mg/dL
Ketones, ur: NEGATIVE mg/dL
Leukocytes,Ua: NEGATIVE
Nitrite: NEGATIVE
Protein, ur: NEGATIVE mg/dL
Specific Gravity, Urine: 1.001 — ABNORMAL LOW (ref 1.005–1.030)
WBC, UA: 0 WBC/hpf (ref 0–5)
pH: 6 (ref 5.0–8.0)

## 2024-02-07 LAB — CBC
HCT: 37 % (ref 36.0–46.0)
Hemoglobin: 12 g/dL (ref 12.0–15.0)
MCH: 29.1 pg (ref 26.0–34.0)
MCHC: 32.4 g/dL (ref 30.0–36.0)
MCV: 89.6 fL (ref 80.0–100.0)
Platelets: 238 10*3/uL (ref 150–400)
RBC: 4.13 MIL/uL (ref 3.87–5.11)
RDW: 15.2 % (ref 11.5–15.5)
WBC: 8.3 10*3/uL (ref 4.0–10.5)
nRBC: 0 % (ref 0.0–0.2)

## 2024-02-07 LAB — GLUCOSE, CAPILLARY
Glucose-Capillary: 81 mg/dL (ref 70–99)
Glucose-Capillary: 51 mg/dL — ABNORMAL LOW (ref 70–99)
Glucose-Capillary: 59 mg/dL — ABNORMAL LOW (ref 70–99)
Glucose-Capillary: 72 mg/dL (ref 70–99)

## 2024-02-07 LAB — HEMOGLOBIN A1C
Hgb A1c MFr Bld: 5.5 % (ref 4.8–5.6)
Mean Plasma Glucose: 111.15 mg/dL

## 2024-02-07 MED ORDER — ONDANSETRON HCL 4 MG PO TABS: 4.0000 mg | ORAL_TABLET | Freq: Four times a day (QID) | ORAL | Status: DC | PRN

## 2024-02-07 MED ORDER — ENOXAPARIN SODIUM 40 MG/0.4ML IJ SOSY
40.0000 mg | PREFILLED_SYRINGE | INTRAMUSCULAR | Status: DC
  Filled 2024-02-07: qty 0.4

## 2024-02-07 MED ORDER — ACETAMINOPHEN 650 MG RE SUPP: 650.0000 mg | Freq: Four times a day (QID) | RECTAL | Status: DC | PRN

## 2024-02-07 MED ORDER — LISINOPRIL 5 MG PO TABS
5.0000 mg | ORAL_TABLET | Freq: Every day | ORAL | Status: DC
  Administered 2024-02-08: 5 mg via ORAL
  Filled 2024-02-07 (×2): qty 1

## 2024-02-07 MED ORDER — INSULIN ASPART 100 UNIT/ML IJ SOLN: 0.0000 [IU] | Freq: Three times a day (TID) | INTRAMUSCULAR | Status: DC

## 2024-02-07 MED ORDER — INSULIN ASPART 100 UNIT/ML IJ SOLN: 0.0000 [IU] | INTRAMUSCULAR | Status: DC

## 2024-02-07 MED ORDER — DEXTROSE 50 % IV SOLN: 1.0000 | INTRAVENOUS | Status: DC | PRN

## 2024-02-07 MED ORDER — ONDANSETRON HCL 4 MG/2ML IJ SOLN: 4.0000 mg | Freq: Four times a day (QID) | INTRAMUSCULAR | Status: DC | PRN

## 2024-02-07 MED ORDER — ACETAMINOPHEN 325 MG PO TABS: 650.0000 mg | ORAL_TABLET | Freq: Four times a day (QID) | ORAL | Status: DC | PRN

## 2024-02-07 NOTE — ED Notes (Signed)
Pt given a cup of  juice.

## 2024-02-07 NOTE — ED Notes (Signed)
 Pt given a few packs of crackers

## 2024-02-07 NOTE — ED Notes (Signed)
 Cbg 52

## 2024-02-07 NOTE — ED Notes (Signed)
 Advised nurse that patient has ready bed

## 2024-02-07 NOTE — ED Provider Notes (Signed)
 [facility] Provider Note    Event Date/Time   First MD Initiated Contact with Patient 02/07/24 1432     (approximate)   History   Hypoglycemia   HPI  Kristina Doyle is a 73 y.o. female who presents to the ED for evaluation of Hypoglycemia   Review of PCP visit from 2 months ago.  History of diabetes on glimepiride  She presents to the ED alongside her grandson for evaluation of episodic hypoglycemia in the past 2 days, as well as 2-3 days of urinary frequency.  No fevers, abdominal pain, emesis, stool changes.  No medication or dosage changes.  Sugars as low as 30s yesterday at home and today at home.  Went to an urgent care yesterday.  She took her medication this morning.   Physical Exam   Triage Vital Signs: ED Triage Vitals  Encounter Vitals Group     BP 02/07/24 1416 (!) 157/119     Systolic BP Percentile --      Diastolic BP Percentile --      Pulse Rate 02/07/24 1416 88     Resp 02/07/24 1416 20     Temp 02/07/24 1416 98.4 F (36.9 C)     Temp Source 02/07/24 1416 Oral     SpO2 02/07/24 1416 99 %     Weight 02/07/24 1418 122 lb (55.3 kg)     Height 02/07/24 1418 5\' 5"  (1.651 m)     Head Circumference --      Peak Flow --      Pain Score 02/07/24 1415 0     Pain Loc --      Pain Education --      Exclude from Growth Chart --     Most recent vital signs: Vitals:   02/07/24 1416  BP: (!) 157/119  Pulse: 88  Resp: 20  Temp: 98.4 F (36.9 C)  SpO2: 99%    General: Awake, no distress.  CV:  Good peripheral perfusion.  Resp:  Normal effort.  Abd:  No distention.  MSK:  No deformity noted.  Neuro:  No focal deficits appreciated. Other:     ED Results / Procedures / Treatments   Labs (all labs ordered are listed, but only abnormal results are displayed) Labs Reviewed  URINALYSIS, ROUTINE W REFLEX MICROSCOPIC - Abnormal; Notable for the following components:      Result Value   Color, Urine COLORLESS (*)     APPearance CLEAR (*)    Specific Gravity, Urine 1.001 (*)    Hgb urine dipstick SMALL (*)    All other components within normal limits  CBG MONITORING, ED - Abnormal; Notable for the following components:   Glucose-Capillary 67 (*)    All other components within normal limits  CBC  BASIC METABOLIC PANEL WITH GFR  CBG MONITORING, ED  CBG MONITORING, ED    EKG   RADIOLOGY   Official radiology report(s): No results found.  PROCEDURES and INTERVENTIONS:  .Critical Care  Performed by: Arline Bennett, MD Authorized by: Arline Bennett, MD   Critical care provider statement:    Critical care time (minutes):  30   Critical care time was exclusive of:  Separately billable procedures and treating other patients   Critical care was necessary to treat or prevent imminent or life-threatening deterioration of the following conditions:  Metabolic crisis and endocrine crisis   Critical care was time spent personally by me on the following activities:  Development of treatment plan  with patient or surrogate, discussions with consultants, evaluation of patient's response to treatment, examination of patient, ordering and review of laboratory studies, ordering and review of radiographic studies, ordering and performing treatments and interventions, pulse oximetry, re-evaluation of patient's condition and review of old charts   Medications - No data to display   IMPRESSION / MDM / ASSESSMENT AND PLAN / ED COURSE  I reviewed the triage vital signs and the nursing notes.  Differential diagnosis includes, but is not limited to, cystitis or UTI, medication side effect,  {Patient presents with symptoms of an acute illness or injury that is potentially life-threatening.  Patient on glimepiride presents with episodic hypoglycemia in the past couple days.  While her only coexisting symptoms was urinary frequency, her UA does not show signs of cystitis.  Normal CBC without clear infectious etiology  contributing to her symptoms.  Improving glucose with oral intake but suspect she will have further episodes of hypoglycemia in the setting of her sulfonylurea so I consult medicine for admission.      FINAL CLINICAL IMPRESSION(S) / ED DIAGNOSES   Final diagnoses:  Hypoglycemia  Hypoglycemia secondary to sulfonylurea, accidental or unintentional, initial encounter     Rx / DC Orders   ED Discharge Orders     None        Note:  This document was prepared using Dragon voice recognition software and may include unintentional dictation errors.   Arline Bennett, MD 02/07/24 [phone]

## 2024-02-07 NOTE — ED Notes (Signed)
 Pt and family member stated they did not want pt to take lisinopril because she already took her blood pressure medication for the day.

## 2024-02-07 NOTE — H&P (Signed)
 History and Physical  Kristina Doyle EAV:[id number] DOB: 1951/04/30 DOA: 02/07/2024  PCP: Antonio Baumgarten, MD   Chief Complaint: Hypoglycemia  HPI: Kristina Doyle is a 73 y.o. female with medical history significant for hypertension, type 2 diabetes on glimepiride being admitted to the hospital with symptomatic hypoglycemia.  Patient states she has been on glimepiride for years, her diabetes is well-controlled states that her last hemoglobin A1c was 6.7.  She has been eating a stable diet, has lost about 10 pounds unintentionally in the last couple of months.  Denies any fever, cough, nausea, vomiting.  Yesterday she was feeling little bit woozy, checked her blood sugar and it was in the 30s, she went to urgent care.  After that, her blood sugar went up she went home and was feeling better the rest of the day.  This morning, she took her glimepiride and once again felt hypoglycemic her blood sugar was 37 she ate some candy when it was rechecked here in the ER it was 67.  Review of Systems: Please see HPI for pertinent positives and negatives. A complete 10 system review of systems are otherwise negative.  Past Medical History:  Diagnosis Date   Diabetes mellitus without complication (HCC)    Hypertension    Hypoglycemia    Past Surgical History:  Procedure Laterality Date   COLONOSCOPY  11/05/2007   COLONOSCOPY WITH PROPOFOL  N/A 04/06/2018   Procedure: COLONOSCOPY WITH PROPOFOL ;  Surgeon: Deveron Fly, MD;  Location: [facility] ENDOSCOPY;  Service: Endoscopy;  Laterality: N/A;   LAPAROSCOPIC SALPINGOOPHERECTOMY     ectopic pregnancy   Social History:  reports that she has been smoking cigarettes. She has never used smokeless tobacco. She reports that she does not currently use alcohol. No history on file for drug use.  No Known Allergies  Family History  Problem Relation Age of Onset   Breast cancer Neg Hx      Prior to Admission medications   Medication Sig Start Date  End Date Taking? Authorizing Provider  Calcium Carb-Cholecalciferol 600-400 MG-UNIT CAPS Take 1 tablet by mouth daily.    [provider]  glimepiride (AMARYL) 4 MG tablet Take 4 mg by mouth 2 (two) times daily.    [provider]  insulin detemir (LEVEMIR) 100 UNIT/ML injection Inject 20 Units into the skin at bedtime.    [provider]  lisinopril (PRINIVIL,ZESTRIL) 5 MG tablet Take 5 mg by mouth daily.    [provider]    Physical Exam: BP (!) 157/74   Pulse 70   Temp 98.4 F (36.9 C) (Oral)   Resp 17   Ht 5\' 5"  (1.651 m)   Wt 55.3 kg   SpO2 100%   BMI 20.30 kg/m  General:  Alert, oriented, calm, in no acute distress, her grandson is at the bedside Eyes: EOMI, clear conjuctivae, white sclerea Neck: supple, no masses, trachea mildline  Cardiovascular: RRR, no murmurs or rubs, no peripheral edema  Respiratory: clear to auscultation bilaterally, no wheezes, no crackles  Abdomen: soft, nontender, nondistended, normal bowel tones heard  Skin: dry, no rashes  Musculoskeletal: no joint effusions, normal range of motion  Psychiatric: appropriate affect, normal speech  Neurologic: extraocular muscles intact, clear speech, moving all extremities with intact sensorium         Labs on Admission:  Basic Metabolic Panel: Recent Labs  Lab 02/07/24 1420  NA 138  K 3.8  CL 106  CO2 21*  GLUCOSE 60*  BUN 11  CREATININE 0.60  CALCIUM 9.0   Liver Function Tests: No results for input(s): "AST", "ALT", "ALKPHOS", "BILITOT", "PROT", "ALBUMIN" in the last 168 hours. No results for input(s): "LIPASE", "AMYLASE" in the last 168 hours. No results for input(s): "AMMONIA" in the last 168 hours. CBC: Recent Labs  Lab 02/07/24 1420  WBC 8.3  HGB 12.0  HCT 37.0  MCV 89.6  PLT 238   Cardiac Enzymes: No results for input(s): "CKTOTAL", "CKMB", "CKMBINDEX", "TROPONINI" in the last 168 hours. BNP (last 3 results) No results for input(s): "BNP" in the  last 8760 hours.  ProBNP (last 3 results) No results for input(s): "PROBNP" in the last 8760 hours.  CBG: Recent Labs  Lab 02/07/24 1415 02/07/24 1459  GLUCAP 67* 75   Radiological Exams on Admission: No results found.  Assessment/Plan Kristina Doyle is a 73 y.o. female with medical history significant for hypertension, type 2 diabetes on glimepiride being admitted to the hospital with symptomatic hypoglycemia.  Symptomatic hypoglycemia-likely due to glimepiride, which has a half-life of 5 to 9 hours, and is known to potentially start causing hypoglycemia in patients who previously tolerated it well.  Patient has had about 10 pounds of weight loss in the last few months, which could be contributing.  No evidence of acute illness or other complication.  Given this has recurred twice in the last 24 hours, will observe patient closely off of glimepiride to make sure that hypoglycemia does not recur. -Observation admission -Discontinue glimepiride -Monitor blood sugars before every meal and at bedtime or in case of symptoms  Hypertension-continue home lisinopril  DVT prophylaxis: Lovenox     Code Status: Full Code  Consults called: None  Admission status: Observation  Time spent: 42 minutes  Jackey Housey Rickey Charm MD [facility] Pager [phone]  If 7PM-7AM, please contact night-coverage [email or web address] Password [facility]  02/07/2024, 3:38 PM

## 2024-02-07 NOTE — ED Triage Notes (Signed)
 Pt to ED with son for low BG since yesterday, seen at [facility] yesterday for same. BG was 37 about 1 hour ago and then ate some candy. Pt is ambulatory, oriented, and skin is dry. States has been taking oral antidiabetic meds without checking BG first. CBG is 67.

## 2024-02-08 DIAGNOSIS — E162 Hypoglycemia, unspecified: Secondary | ICD-10-CM | POA: Diagnosis not present

## 2024-02-08 LAB — GLUCOSE, CAPILLARY
Glucose-Capillary: 106 mg/dL — ABNORMAL HIGH (ref 70–99)
Glucose-Capillary: 133 mg/dL — ABNORMAL HIGH (ref 70–99)
Glucose-Capillary: 51 mg/dL — ABNORMAL LOW (ref 70–99)
Glucose-Capillary: 74 mg/dL (ref 70–99)
Glucose-Capillary: 83 mg/dL (ref 70–99)
Glucose-Capillary: 84 mg/dL (ref 70–99)
Glucose-Capillary: 96 mg/dL (ref 70–99)

## 2024-02-08 LAB — BASIC METABOLIC PANEL WITH GFR
Anion gap: 9 (ref 5–15)
BUN: 10 mg/dL (ref 8–23)
CO2: 23 mmol/L (ref 22–32)
Calcium: 8.8 mg/dL — ABNORMAL LOW (ref 8.9–10.3)
Chloride: 104 mmol/L (ref 98–111)
Creatinine, Ser: 0.59 mg/dL (ref 0.44–1.00)
GFR, Estimated: >60 mL/min (ref >60)
Glucose, Bld: 96 mg/dL (ref 70–99)
Potassium: 4 mmol/L (ref 3.5–5.1)
Sodium: 136 mmol/L (ref 135–145)

## 2024-02-08 LAB — CBC
HCT: 33.1 % — ABNORMAL LOW (ref 36.0–46.0)
Hemoglobin: 11.2 g/dL — ABNORMAL LOW (ref 12.0–15.0)
MCH: 29.7 pg (ref 26.0–34.0)
MCHC: 33.8 g/dL (ref 30.0–36.0)
MCV: 87.8 fL (ref 80.0–100.0)
Platelets: 227 10*3/uL (ref 150–400)
RBC: 3.77 MIL/uL — ABNORMAL LOW (ref 3.87–5.11)
RDW: 15.1 % (ref 11.5–15.5)
WBC: 6.7 10*3/uL (ref 4.0–10.5)
nRBC: 0 % (ref 0.0–0.2)

## 2024-02-08 MED ORDER — ENSURE ENLIVE PO LIQD: 237.0000 mL | Freq: Two times a day (BID) | ORAL | 12 refills | Status: AC

## 2024-02-08 MED ORDER — ENSURE ENLIVE PO LIQD: 237.0000 mL | Freq: Two times a day (BID) | ORAL | Status: DC

## 2024-02-08 MED ORDER — PRAVASTATIN SODIUM 20 MG PO TABS: 40.0000 mg | ORAL_TABLET | Freq: Every day | ORAL | Status: DC

## 2024-02-08 MED ORDER — OYSTER SHELL CALCIUM/D3 500-5 MG-MCG PO TABS
1.0000 | ORAL_TABLET | Freq: Every day | ORAL | Status: DC
  Administered 2024-02-08: 1 via ORAL
  Filled 2024-02-08: qty 1

## 2024-02-08 NOTE — Discharge Instructions (Signed)
 Keep log of  your sugars at home and discuss with Dr Solum

## 2024-02-08 NOTE — Discharge Summary (Signed)
 Physician Discharge Summary   Patient: Kristina Doyle MRN: [id number] DOB: May 18, 1951  Admit date:     02/07/2024  Discharge date: 02/08/24  Discharge Physician: Melvinia Stager   PCP: Antonio Baumgarten, MD   Recommendations at discharge:    F/u Dr Kevan Peers in 1-2 weeks F/u Dr Lorelei Rogers in 1-2 weeks Keep log of sugars at home and d/w dr Lorelei Rogers  Discharge Diagnoses: Principal Problem:   Hypoglycemia  Kristina Doyle is a 73 y.o. female with medical history significant for hypertension, type 2 diabetes on glimepiride being admitted to the hospital with symptomatic hypoglycemia.  Patient states she has been on glimepiride for years, her diabetes is well-controlled states that her last (outpatient) hemoglobin A1c was 6.7.  She has been eating a stable diet, has lost about 10 pounds   Symptomatic hypoglycemia-likely due to glimepiride  Patient has had about 10 pounds of weight loss in the last few months, which could be contributing.  --Repeat A1c 5.5% -Discontinue glimepiride till further instructions - Sugars now better --tolerating po diet -- patient advised to keep log of sugars at home and discussed with Dr Lorelei Rogers   Hypertension-continue home lisinopril  Hemodynamically stable. Discharge plan discussed with patient and grandson.        Disposition: Home Diet recommendation:  Discharge Diet Orders (From admission, onward)     Start     Ordered   02/08/24 0000  Diet - low sodium heart healthy        02/08/24 0918           Carb modified diet DISCHARGE MEDICATION: Allergies as of 02/08/2024   No Known Allergies      Medication List     PAUSE taking these medications    glimepiride 4 MG tablet Wait to take this until your doctor or other care provider tells you to start again. Commonly known as: AMARYL Take 4 mg by mouth 2 (two) times daily.       TAKE these medications    Calcium Carb-Cholecalciferol 600-400 MG-UNIT Caps Take 1 tablet by mouth daily.    feeding supplement Liqd Take 237 mLs by mouth 2 (two) times daily between meals.   lisinopril 5 MG tablet Commonly known as: ZESTRIL Take 5 mg by mouth daily.   pravastatin 40 MG tablet Commonly known as: PRAVACHOL Take 1 tablet by mouth at bedtime.        Follow-up Information     Antonio Baumgarten, MD. Schedule an appointment as soon as possible for a visit in 1 week(s).   Specialty: Internal Medicine Why: Office closed today patient to make own follow up appt Contact information: [address] [place] [address] [phone]         Carlon Chester, MD. Call in 1 week(s).   Specialty: Endocrinology Why: Office closed today patient to make own follow up appt  f/u Dm-2 and sugar check Contact information: [address] [facility] [place] [address] [phone]                Discharge Exam: Cleavon Curls Weights   02/07/24 1418  Weight: 55.3 kg   Alert and oriented times three respiratory clear to auscultation cardiovascular both heart sounds normal no murmur abdomen soft benign nontender neuro- grossly intact  Condition at discharge: fair  The results of significant diagnostics from this hospitalization (including imaging, microbiology, ancillary and laboratory) are listed below for reference.   Imaging Studies: No results found.  Microbiology:  No results found for this or any previous visit.  Labs: CBC: Recent Labs  Lab 02/07/24 1420 02/08/24 0453  WBC 8.3 6.7  HGB 12.0 11.2*  HCT 37.0 33.1*  MCV 89.6 87.8  PLT 238 227   Basic Metabolic Panel: Recent Labs  Lab 02/07/24 1420 02/08/24 0453  NA 138 136  K 3.8 4.0  CL 106 104  CO2 21* 23  GLUCOSE 60* 96  BUN 11 10  CREATININE 0.60 0.59  CALCIUM 9.0 8.8*   CBG: Recent Labs  Lab 02/08/24 0242 02/08/24 0453 02/08/24 0636 02/08/24 0740 02/08/24 0916  GLUCAP 84 106* 83 74 133*    Discharge time spent: greater than 30  minutes.  Signed: Melvinia Stager, MD [facility] 02/08/2024

## 2024-02-08 NOTE — TOC CM/SW Note (Signed)
 Transition of Care [facility]) - Inpatient Brief Assessment   Patient Details  Name: Kristina Doyle MRN: [id number] Date of Birth: 05-19-1951  Transition of Care [facility]) CM/SW Contact:    Odilia Bennett, LCSW Phone Number: 02/08/2024, 9:22 AM   Clinical Narrative: Patient has orders to discharge home today. Chart reviewed. No TOC needs identified. CSW signing off.  Transition of Care Asessment: Insurance and Status: Insurance coverage has been reviewed Patient has primary care physician: Yes Home environment has been reviewed: Single family home Prior level of function:: Not documented Prior/Current Home Services: No current home services Social Drivers of Health Review: SDOH reviewed no interventions necessary Readmission risk has been reviewed: Yes Transition of care needs: no transition of care needs at this time

## 2024-03-10 ENCOUNTER — Other Ambulatory Visit: Payer: Self-pay | Admitting: Internal Medicine

## 2024-03-10 DIAGNOSIS — Z1231 Encounter for screening mammogram for malignant neoplasm of breast: Secondary | ICD-10-CM

## 2024-03-23 ENCOUNTER — Ambulatory Visit
Admission: RE | Admit: 2024-03-23 | Discharge: 2024-03-23 | Disposition: A | Discharge status: Home / Self Care | Source: Ambulatory Visit | Attending: Internal Medicine | Admitting: Internal Medicine

## 2024-03-23 DIAGNOSIS — Z1231 Encounter for screening mammogram for malignant neoplasm of breast: Secondary | ICD-10-CM | POA: Insufficient documentation
